# Patient Record
Sex: Male | Born: 2000 | Race: White | Hispanic: No | Marital: Single | Smoking: Current every day smoker
Health system: Southern US, Community
[De-identification: ages and names within clinical notes are randomized; demographics above are authoritative.]

## PROBLEM LIST (undated history)

## (undated) DIAGNOSIS — F329 Major depressive disorder, single episode, unspecified: Secondary | ICD-10-CM

## (undated) DIAGNOSIS — F32A Depression, unspecified: Secondary | ICD-10-CM

## (undated) DIAGNOSIS — F419 Anxiety disorder, unspecified: Secondary | ICD-10-CM

## (undated) DIAGNOSIS — F909 Attention-deficit hyperactivity disorder, unspecified type: Secondary | ICD-10-CM

## (undated) DIAGNOSIS — T7840XA Allergy, unspecified, initial encounter: Secondary | ICD-10-CM

## (undated) DIAGNOSIS — K5900 Constipation, unspecified: Secondary | ICD-10-CM

## (undated) HISTORY — DX: Anxiety disorder, unspecified: F41.9

## (undated) HISTORY — DX: Depression, unspecified: F32.A

## (undated) HISTORY — DX: Major depressive disorder, single episode, unspecified: F32.9

## (undated) HISTORY — DX: Allergy, unspecified, initial encounter: T78.40XA

## (undated) HISTORY — DX: Attention-deficit hyperactivity disorder, unspecified type: F90.9

---
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
## 2019-07-08 ENCOUNTER — Ambulatory Visit (INDEPENDENT_AMBULATORY_CARE_PROVIDER_SITE_OTHER): Admitting: Family Medicine

## 2019-07-08 ENCOUNTER — Other Ambulatory Visit: Payer: Self-pay

## 2019-07-08 ENCOUNTER — Encounter: Payer: Self-pay | Admitting: Family Medicine

## 2019-07-08 VITALS — BP 134/85 | HR 67 | Temp 98.4°F | Resp 16 | Ht 69.5 in | Wt 121.0 lb

## 2019-07-08 DIAGNOSIS — N50812 Left testicular pain: Secondary | ICD-10-CM | POA: Diagnosis not present

## 2019-07-08 NOTE — Progress Notes (Signed)
OFFICE VISIT  07/08/2019   CC:  Chief Complaint  Patient presents with  . Testicle pain   HPI:    Patient is a 18 y.o. Caucasian male who presents for testicle pain. Painful left testicle 07/01/19, constant pain/squeezing sensation for about 24h, spontaneous resolution the next day.  He does feel like the L testicle is a little bigger than R and ? Whether he feels something in L scrotum.   No preceding trauma or intercourse. No recurrence of pain since 07/01/19. Denies hx of STD.  No penile d/c and no GU skin lesion or dysuria.   Past Medical History:  Diagnosis Date  . ADHD   . Allergy   . Anxiety   . Depression     History reviewed. No pertinent surgical history.  Outpatient Medications Prior to Visit  Medication Sig Dispense Refill  . omeprazole (PRILOSEC) 40 MG capsule Take 1 capsule (40 mg total) by mouth daily. (Patient not taking: Reported on 07/08/2019) 90 capsule 0  . ondansetron (ZOFRAN) 4 MG tablet Take 1 tablet (4 mg total) by mouth every 8 (eight) hours as needed for nausea or vomiting. (Patient not taking: Reported on 07/08/2019) 20 tablet 0   No facility-administered medications prior to visit.     No Known Allergies  ROS As per HPI  PE: Blood pressure 134/85, pulse 67, temperature 98.4 F (36.9 C), temperature source Temporal, resp. rate 16, height 5' 9.5" (1.765 m), weight 121 lb (54.9 kg), SpO2 100 %. Gen: Alert, well appearing.  Patient is oriented to person, place, time, and situation. AFFECT: pleasant, lucid thought and speech. Genitals normal; both testes normal without tenderness, masses, hydroceles, varicoceles, erythema or swelling. Shaft normal, circumcised, meatus normal without discharge. No inguinal hernia noted. No inguinal lymphadenopathy.   LABS:  none  IMPRESSION AND PLAN:  Left testicle pain, resolved. Exam normal. He points to L epididymal area and when I put my finger on epididymus today he said this is where to pain was felt  the most when he had it.   Low suspicion of any worrisome GU pathology at this time. Reassured pt. Signs/symptoms to call or return for were reviewed and pt expressed understanding.  An After Visit Summary was printed and given to the patient.  FOLLOW UP: No follow-ups on file.  Signed:  Crissie Sickles, MD           07/08/2019

## 2019-08-15 ENCOUNTER — Emergency Department (HOSPITAL_COMMUNITY)
Admission: EM | Admit: 2019-08-15 | Discharge: 2019-08-15 | Disposition: A | Discharge status: Home / Self Care | Attending: Emergency Medicine | Admitting: Emergency Medicine

## 2019-08-15 ENCOUNTER — Encounter (HOSPITAL_COMMUNITY): Payer: Self-pay | Admitting: *Deleted

## 2019-08-15 ENCOUNTER — Emergency Department (HOSPITAL_COMMUNITY)

## 2019-08-15 ENCOUNTER — Other Ambulatory Visit: Payer: Self-pay

## 2019-08-15 DIAGNOSIS — N200 Calculus of kidney: Secondary | ICD-10-CM | POA: Diagnosis not present

## 2019-08-15 DIAGNOSIS — R1032 Left lower quadrant pain: Secondary | ICD-10-CM | POA: Diagnosis present

## 2019-08-15 DIAGNOSIS — N390 Urinary tract infection, site not specified: Secondary | ICD-10-CM | POA: Diagnosis not present

## 2019-08-15 DIAGNOSIS — F17210 Nicotine dependence, cigarettes, uncomplicated: Secondary | ICD-10-CM | POA: Diagnosis not present

## 2019-08-15 DIAGNOSIS — R319 Hematuria, unspecified: Secondary | ICD-10-CM

## 2019-08-15 HISTORY — DX: Constipation, unspecified: K59.00

## 2019-08-15 LAB — URINALYSIS, ROUTINE W REFLEX MICROSCOPIC
Bilirubin Urine: NEGATIVE
Glucose, UA: NEGATIVE mg/dL
Ketones, ur: 20 mg/dL — AB
Leukocytes,Ua: NEGATIVE
Nitrite: POSITIVE — AB
Protein, ur: 100 mg/dL — AB
Specific Gravity, Urine: 1.024 (ref 1.005–1.030)
pH: 5 (ref 5.0–8.0)

## 2019-08-15 LAB — CBC
HCT: 51 % (ref 39.0–52.0)
Hemoglobin: 16.9 g/dL (ref 13.0–17.0)
MCH: 30.2 pg (ref 26.0–34.0)
MCHC: 33.1 g/dL (ref 30.0–36.0)
MCV: 91.1 fL (ref 80.0–100.0)
Platelets: 181 10*3/uL (ref 150–400)
RBC: 5.6 MIL/uL (ref 4.22–5.81)
RDW: 12.8 % (ref 11.5–15.5)
WBC: 6.7 10*3/uL (ref 4.0–10.5)
nRBC: 0 % (ref 0.0–0.2)

## 2019-08-15 LAB — COMPREHENSIVE METABOLIC PANEL
ALT: 72 U/L — ABNORMAL HIGH (ref 0–44)
AST: 38 U/L (ref 15–41)
Albumin: 5.1 g/dL — ABNORMAL HIGH (ref 3.5–5.0)
Alkaline Phosphatase: 94 U/L (ref 38–126)
Anion gap: 11 (ref 5–15)
BUN: 14 mg/dL (ref 6–20)
CO2: 27 mmol/L (ref 22–32)
Calcium: 10.3 mg/dL (ref 8.9–10.3)
Chloride: 101 mmol/L (ref 98–111)
Creatinine, Ser: 0.87 mg/dL (ref 0.61–1.24)
GFR calc Af Amer: >60 mL/min (ref >60)
GFR calc non Af Amer: >60 mL/min (ref >60)
Glucose, Bld: 93 mg/dL (ref 70–99)
Potassium: 4.6 mmol/L (ref 3.5–5.1)
Sodium: 139 mmol/L (ref 135–145)
Total Bilirubin: 1.6 mg/dL — ABNORMAL HIGH (ref 0.3–1.2)
Total Protein: 8.4 g/dL — ABNORMAL HIGH (ref 6.5–8.1)

## 2019-08-15 LAB — LIPASE, BLOOD: Lipase: 14 U/L (ref 11–51)

## 2019-08-15 MED ORDER — SODIUM CHLORIDE 0.9% FLUSH: 3.0000 mL | Freq: Once | INTRAVENOUS | Status: DC

## 2019-08-15 MED ORDER — TAMSULOSIN HCL 0.4 MG PO CAPS: 0.4000 mg | ORAL_CAPSULE | Freq: Every day | ORAL | 0 refills | Status: AC

## 2019-08-15 MED ORDER — ONDANSETRON 4 MG PO TBDP: 4.0000 mg | ORAL_TABLET | Freq: Three times a day (TID) | ORAL | 0 refills | Status: AC | PRN

## 2019-08-15 MED ORDER — IOHEXOL 300 MG/ML  SOLN
80.0000 mL | Freq: Once | INTRAMUSCULAR | Status: AC | PRN
  Administered 2019-08-15: 80 mL via INTRAVENOUS

## 2019-08-15 MED ORDER — HYDROCODONE-ACETAMINOPHEN 5-325 MG PO TABS: 1.0000 | ORAL_TABLET | ORAL | 0 refills | Status: AC | PRN

## 2019-08-15 MED ORDER — CEPHALEXIN 500 MG PO CAPS: 500.0000 mg | ORAL_CAPSULE | Freq: Four times a day (QID) | ORAL | 0 refills | Status: AC

## 2019-08-15 NOTE — ED Notes (Signed)
Urine spec to lab 

## 2019-08-15 NOTE — ED Notes (Signed)
KS, PA assessing

## 2019-08-15 NOTE — ED Notes (Signed)
From CT 

## 2019-08-15 NOTE — Discharge Instructions (Signed)
Schedule to see the Urologist for evaluation  

## 2019-08-15 NOTE — ED Notes (Signed)
Call to CT   Pt ready

## 2019-08-15 NOTE — ED Triage Notes (Signed)
Pt with LLQ pain with N/V/D for a month.

## 2019-08-15 NOTE — ED Notes (Signed)
Pt reports LLQ pain for one month  Hx of constipation/anxiety  Has not seen PCP

## 2019-08-15 NOTE — ED Provider Notes (Signed)
[facility] EMERGENCY DEPARTMENT Provider Note   CSN: [id number] Arrival date & time: 08/15/19  1258     History Chief Complaint  Patient presents with  . Abdominal Pain    Keith Peters is a 19 y.o. male.  The history is provided by the patient. No language interpreter was used.  Abdominal Pain Pain location:  LLQ and L flank Pain quality: aching   Pain severity:  Moderate Onset quality:  Gradual Duration:  2 days Timing:  Intermittent Progression:  Worsening Chronicity:  New Relieved by:  Nothing Worsened by:  Nothing Ineffective treatments:  None tried Associated symptoms: nausea        Past Medical History:  Diagnosis Date  . ADHD   . Allergy   . Anxiety   . Constipation   . Depression     Patient Active Problem List   Diagnosis Date Noted  . Anxiety and fearfulness of childhood and adolescence 02/28/2012  . ADHD (attention deficit hyperactivity disorder), combined type 02/28/2012  . Depression 02/28/2012    History reviewed. No pertinent surgical history.     Family History  Problem Relation Age of Onset  . Cancer Other   . Hypertension Other   . Heart disease Other   . Hyperlipidemia Other   . Alcohol abuse Mother   . Depression Mother   . Drug abuse Mother   . Mental illness Mother   . Miscarriages / [place] Mother   . Depression Father   . Hearing loss Father   . Hyperlipidemia Father   . Hypertension Father   . Mental illness Father   . Stroke Father   . Depression Sister   . Learning disabilities Brother   . Depression Maternal Grandmother   . Hypertension Maternal Grandmother   . Mental illness Maternal Grandmother   . Cancer Maternal Grandfather   . Hypertension Maternal Grandfather   . Hypertension Paternal Grandmother   . Hypertension Paternal Grandfather     Social History   Tobacco Use  . Smoking status: Current Every Day Smoker    Types: Cigarettes  . Smokeless tobacco: Never Used  Substance Use Topics    . Alcohol use: No  . Drug use: Not Currently    Home Medications Prior to Admission medications   Medication Sig Start Date End Date Taking? Authorizing Provider  cephALEXin (KEFLEX) 500 MG capsule Take 1 capsule (500 mg total) by mouth 4 (four) times daily for 7 days. 08/15/19 08/22/19  Fransico Meadow, PA-C  HYDROcodone-acetaminophen (NORCO/VICODIN) 5-325 MG tablet Take 1 tablet by mouth every 4 (four) hours as needed. 08/15/19   Fransico Meadow, PA-C  ondansetron (ZOFRAN ODT) 4 MG disintegrating tablet Take 1 tablet (4 mg total) by mouth every 8 (eight) hours as needed for nausea or vomiting. 08/15/19   Fransico Meadow, PA-C  tamsulosin (FLOMAX) 0.4 MG CAPS capsule Take 1 capsule (0.4 mg total) by mouth daily. 08/15/19   Fransico Meadow, PA-C  omeprazole (PRILOSEC) 40 MG capsule Take 1 capsule (40 mg total) by mouth daily. Patient not taking: Reported on 07/08/2019 11/18/17 08/15/19  Howard Pouch A, DO    Allergies    Patient has no known allergies.  Review of Systems   Review of Systems  Gastrointestinal: Positive for abdominal pain and nausea.  All other systems reviewed and are negative.   Physical Exam Updated Vital Signs BP (!) 114/59 (BP Location: Right Arm)   Pulse (!) 110   Temp 98.3 F (36.8 C) (Oral)  Resp 16   Ht 5\' 10"  (1.778 m)   Wt 54.4 kg   SpO2 99%   BMI 17.22 kg/m   Physical Exam Vitals and nursing note reviewed.  Constitutional:      Appearance: He is well-developed.  HENT:     Head: Normocephalic and atraumatic.  Eyes:     Conjunctiva/sclera: Conjunctivae normal.  Cardiovascular:     Rate and Rhythm: Normal rate and regular rhythm.     Heart sounds: No murmur.  Pulmonary:     Effort: Pulmonary effort is normal. No respiratory distress.     Breath sounds: Normal breath sounds.  Abdominal:     General: Abdomen is flat. Bowel sounds are normal.     Palpations: Abdomen is soft.     Tenderness: There is abdominal tenderness in the left lower quadrant.   Musculoskeletal:     Cervical back: Neck supple.  Skin:    General: Skin is warm and dry.  Neurological:     General: No focal deficit present.     Mental Status: He is alert.  Psychiatric:        Mood and Affect: Mood normal.     ED Results / Procedures / Treatments   Labs (all labs ordered are listed, but only abnormal results are displayed) Labs Reviewed  COMPREHENSIVE METABOLIC PANEL - Abnormal; Notable for the following components:      Result Value   Total Protein 8.4 (*)    Albumin 5.1 (*)    ALT 72 (*)    Total Bilirubin 1.6 (*)    All other components within normal limits  URINALYSIS, ROUTINE W REFLEX MICROSCOPIC - Abnormal; Notable for the following components:   Color, Urine AMBER (*)    APPearance HAZY (*)    Hgb urine dipstick SMALL (*)    Ketones, ur 20 (*)    Protein, ur 100 (*)    Nitrite POSITIVE (*)    Bacteria, UA MANY (*)    All other components within normal limits  LIPASE, BLOOD  CBC    EKG None  Radiology CT ABDOMEN PELVIS W CONTRAST  Result Date: 08/15/2019 CLINICAL DATA:  Left lower quadrant abdominal pain, nausea and vomiting for 1 month. EXAM: CT ABDOMEN AND PELVIS WITH CONTRAST TECHNIQUE: Multidetector CT imaging of the abdomen and pelvis was performed using the standard protocol following bolus administration of intravenous contrast. CONTRAST:  39mL OMNIPAQUE IOHEXOL 300 MG/ML  SOLN COMPARISON:  None. FINDINGS: Lower chest: No significant pulmonary nodules or acute consolidative airspace disease. Hepatobiliary: Normal liver size. No liver mass. Normal gallbladder with no radiopaque cholelithiasis. No biliary ductal dilatation. Pancreas: Normal, with no mass or duct dilation. Spleen: Normal size. No mass. Adrenals/Urinary Tract: Normal adrenals. There is a 4 mm stone at the left ureterovesical junction. Normal caliber ureters. No hydronephrosis. Asymmetrically delayed left contrast nephrogram. No renal masses. Normal nondistended bladder.  Stomach/Bowel: Normal non-distended stomach. Normal caliber small bowel with no small bowel wall thickening. Normal appendix. Normal large bowel with no diverticulosis, large bowel wall thickening or pericolonic fat stranding. Vascular/Lymphatic: Normal caliber abdominal aorta. Patent portal, splenic, hepatic and renal veins. No pathologically enlarged lymph nodes in the abdomen or pelvis. Reproductive: Normal size prostate. Other: No pneumoperitoneum, ascites or focal fluid collection. Musculoskeletal: No aggressive appearing focal osseous lesions. IMPRESSION: Left ureterovesical junction 4 mm stone. Delayed left contrast nephrogram indicating some degree of obstruction, despite absence of hydronephrosis. Electronically Signed   By: 91m M.D.   On: 08/15/2019 17:28  Procedures Procedures (including critical care time)  Medications Ordered in ED Medications  iohexol (OMNIPAQUE) 300 MG/ML solution 80 mL (80 mLs Intravenous Contrast Given 08/15/19 1703)    ED Course  I have reviewed the triage vital signs and the nursing notes.  Pertinent labs & imaging results that were available during my care of the patient were reviewed by me and considered in my medical decision making (see chart for details).    MDM Rules/Calculators/A&P                      MDM  Pt's urine shows wbc's and rbc's.   Ct scan shows 63mm sone at uvj.  Pt counseled on results.  Pt given rx for flomax, hydrocodne, zofran and keflex for possible infection.  Pt advised he should schedule to see urology for follow up.  Final Clinical Impression(s) / ED Diagnoses Final diagnoses:  Kidney stone on left side  Urinary tract infection with hematuria, site unspecified    Rx / DC Orders ED Discharge Orders         Ordered    cephALEXin (KEFLEX) 500 MG capsule  4 times daily     08/15/19 1818    HYDROcodone-acetaminophen (NORCO/VICODIN) 5-325 MG tablet  Every 4 hours PRN     08/15/19 1818    ondansetron (ZOFRAN ODT) 4 MG  disintegrating tablet  Every 8 hours PRN     08/15/19 1818    tamsulosin (FLOMAX) 0.4 MG CAPS capsule  Daily     08/15/19 1818        An After Visit Summary was printed and given to the patient.    Osie Cheeks 08/15/19 2157    Bethann Berkshire, MD 08/16/19 1600

## 2019-08-17 ENCOUNTER — Encounter: Payer: Self-pay | Admitting: Family Medicine

## 2019-08-17 DIAGNOSIS — N200 Calculus of kidney: Secondary | ICD-10-CM | POA: Insufficient documentation

## 2019-08-18 ENCOUNTER — Encounter: Payer: Self-pay | Admitting: Family Medicine

## 2019-08-18 ENCOUNTER — Other Ambulatory Visit: Payer: Self-pay

## 2019-08-18 ENCOUNTER — Ambulatory Visit (INDEPENDENT_AMBULATORY_CARE_PROVIDER_SITE_OTHER): Admitting: Family Medicine

## 2019-08-18 VITALS — Ht 69.5 in

## 2019-08-18 DIAGNOSIS — N200 Calculus of kidney: Secondary | ICD-10-CM

## 2019-08-18 NOTE — Patient Instructions (Signed)

## 2019-08-18 NOTE — Progress Notes (Signed)
VIRTUAL VISIT VIA VIDEO  I connected with [name] on 08/18/19 at  2:00 PM EST by a video enabled telemedicine application and verified that I am speaking with the correct person using two identifiers. Location patient: Home Location provider: [facility], Office Persons participating in the virtual visit: Patient, Dr. Claiborne Billings and R.Baker, LPN  I discussed the limitations of evaluation and management by telemedicine and the availability of in person appointments. The patient expressed understanding and agreed to proceed.   SUBJECTIVE Chief Complaint  Patient presents with  . Abdominal Pain    Pt went to ED and was dx with kidney stone, 8mm. Medication is helping. Pt would like referral to Urology. Pt does not care which MD he sees at urology.     HPI: Joziah Dollins is a 19 y.o. male present for follow-up second to emergency room visit after experiencing acute abdominal pain.  CT was completed and found to have a 4 mm stone, kidney function was normal, CBC normal, urine with many bacteria, small hemoglobin, ketones, nitrites, protein present.  CT abdomen pelvis: Left UV junction 4 mm stone with possible some degree of obstruction, without hydronephrosis per CT. Patient reports since his emergency room visit his pain is better controlled.  He did have one episode of a sharp pain last night and took a pain pill.  His urinary output is unchanged.  He denies any fever, chills or continued nausea.  He is tolerating Keflex antibiotics, Flomax and pain medication prescribed by emergency room.  He is in need of a urology referral today.  ROS: See pertinent positives and negatives per HPI.  Patient Active Problem List   Diagnosis Date Noted  . Nephrolithiasis 08/17/2019  . Anxiety and fearfulness of childhood and adolescence 02/28/2012  . ADHD (attention deficit hyperactivity disorder), combined type 02/28/2012  . Depression 02/28/2012    Social History   Tobacco Use    . Smoking status: Current Every Day Smoker    Types: Cigarettes  . Smokeless tobacco: Never Used  Substance Use Topics  . Alcohol use: No    Current Outpatient Medications:  .  cephALEXin (KEFLEX) 500 MG capsule, Take 1 capsule (500 mg total) by mouth 4 (four) times daily for 7 days., Disp: 28 capsule, Rfl: 0 .  HYDROcodone-acetaminophen (NORCO/VICODIN) 5-325 MG tablet, Take 1 tablet by mouth every 4 (four) hours as needed., Disp: 10 tablet, Rfl: 0 .  ondansetron (ZOFRAN ODT) 4 MG disintegrating tablet, Take 1 tablet (4 mg total) by mouth every 8 (eight) hours as needed for nausea or vomiting., Disp: 20 tablet, Rfl: 0 .  tamsulosin (FLOMAX) 0.4 MG CAPS capsule, Take 1 capsule (0.4 mg total) by mouth daily., Disp: 10 capsule, Rfl: 0  No Known Allergies  OBJECTIVE: Ht 5' 9.5" (1.765 m)   BMI 17.47 kg/m   Gen: No acute distress. Nontoxic in appearance.  Appears well and comfortable. HENT: AT. [place].  MMM.  Eyes:Pupils Equal Round Reactive to light, Extraocular movements intact,  Conjunctiva without redness, discharge or icterus. Neuro: Normal gait. Alert. Oriented x3  Psych: Normal affect, dress and demeanor. Normal speech. Normal thought content and judgment.  ASSESSMENT AND PLAN: Janziel Hockett is a 19 y.o. male present for  Nephrolithiasis Patient with first episode of nephrolithiasis.  His vitals are stable and his pain is controlled at this time.  He is taking the Flomax, pain medication and antibiotic as prescribed. -Referral to urology placed today. - Ambulatory referral to Urology -Emergent  precautions discussed with patient.  If fever develops, worsening pain or decreased urinary output he is to be seen in the emergency room immediately.   Referral Orders     Ambulatory referral to Urology   [facility], DO 08/18/2019

## 2021-08-23 IMAGING — CT CT ABD-PELV W/ CM
2 of 4 series · 15 of 46 positions shown, 17 images · IV contrast (Omnipaque or Isovue)
Comparison: None.

CLINICAL DATA: Left lower quadrant abdominal pain, nausea and
vomiting for 1 month.

EXAM:
CT ABDOMEN AND PELVIS WITH CONTRAST
TECHNIQUE: Multidetector CT imaging of the abdomen and pelvis was performed
using the standard protocol following bolus administration of
intravenous contrast.
CONTRAST:  80mL OMNIPAQUE IOHEXOL 300 MG/ML  SOLN

[Series 2: axial st · axial · 0.73mm/px · z∈[+882,+1297]mm · 12 of 95 slices shown, 14 images]
[im 6/95  soft-tissue]
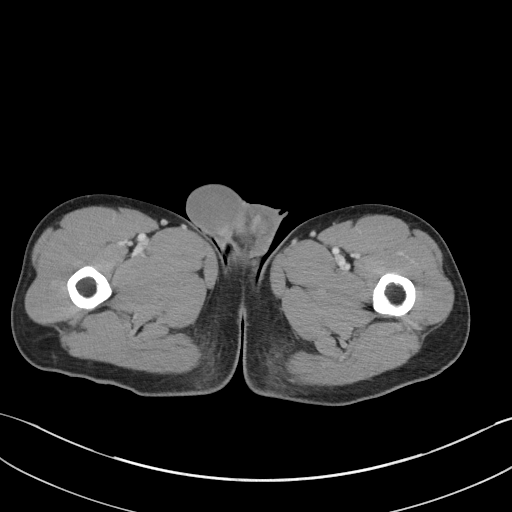
[im 6/95  bone]
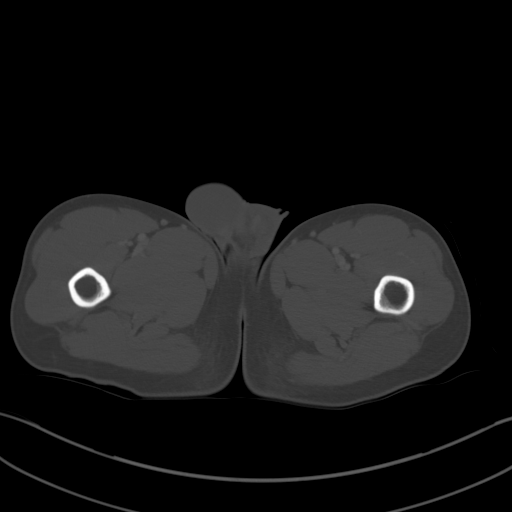
[im 17/95  soft-tissue]
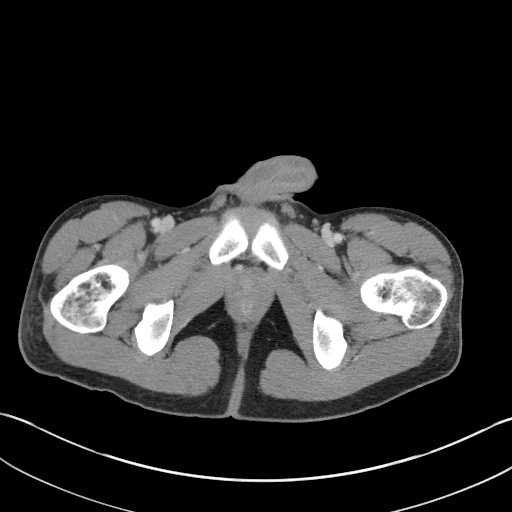
[im 23/95  soft-tissue]
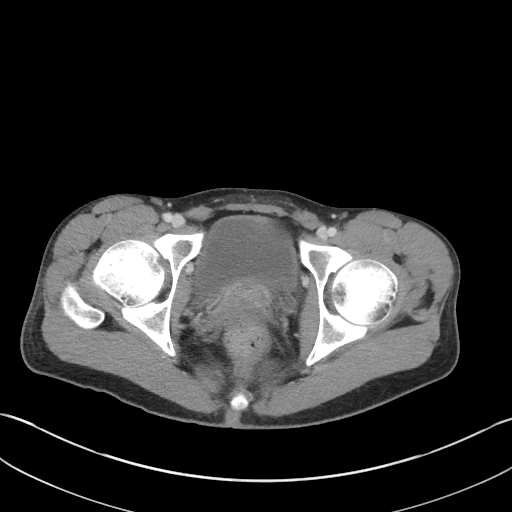
[im 28/95  soft-tissue]
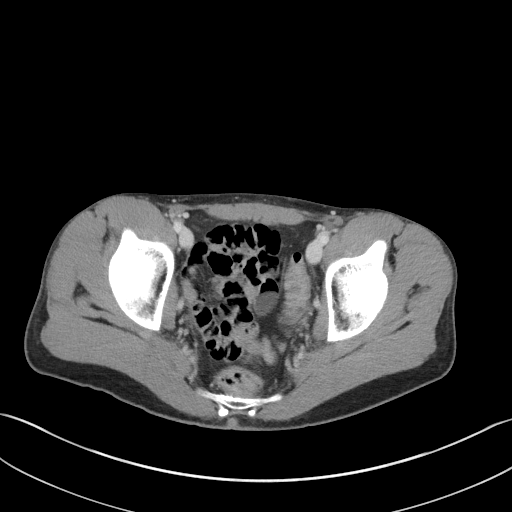
[im 39/95  soft-tissue]
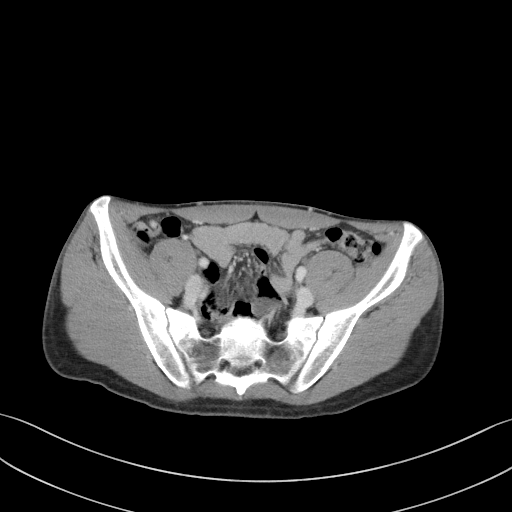
[im 45/95  soft-tissue]
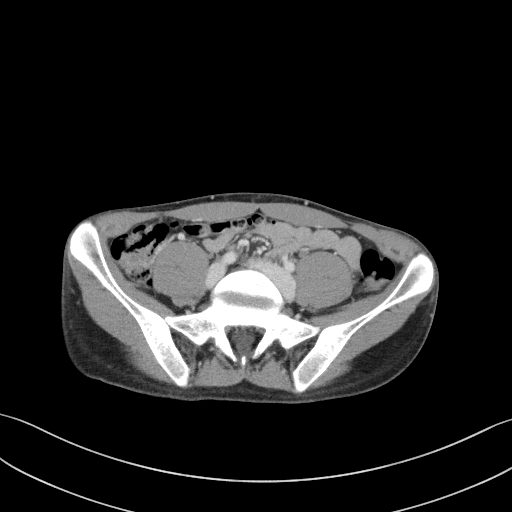
[im 50/95  soft-tissue]
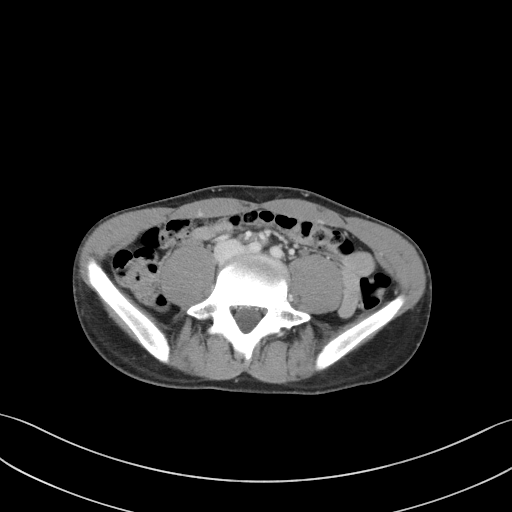
[im 61/95  soft-tissue]
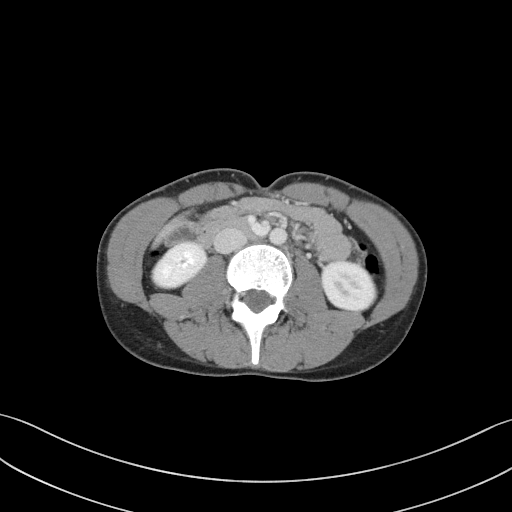
[im 67/95  soft-tissue]
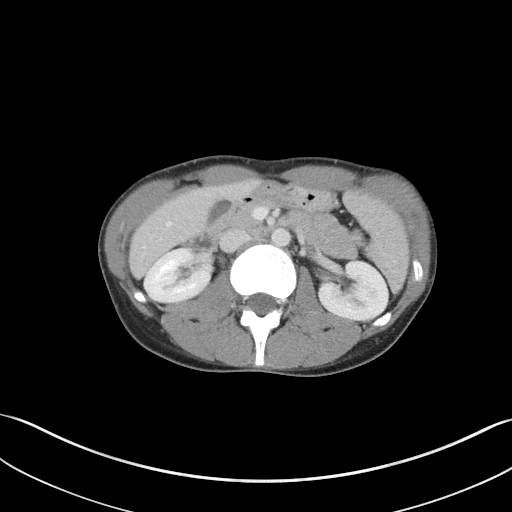
[im 67/95  bone]
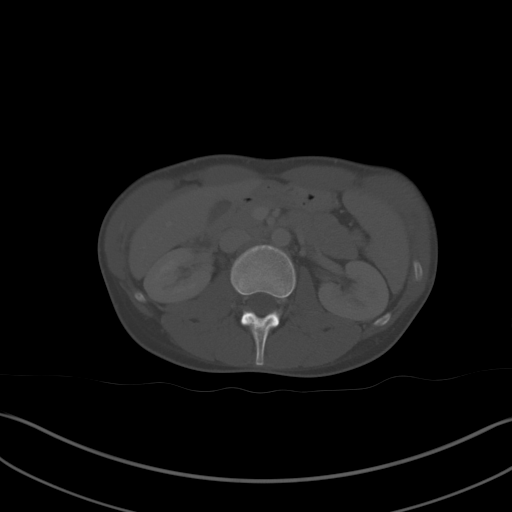
[im 72/95  soft-tissue]
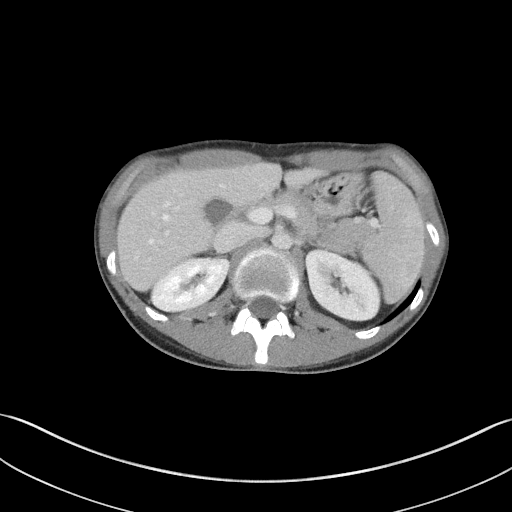
[im 83/95  soft-tissue]
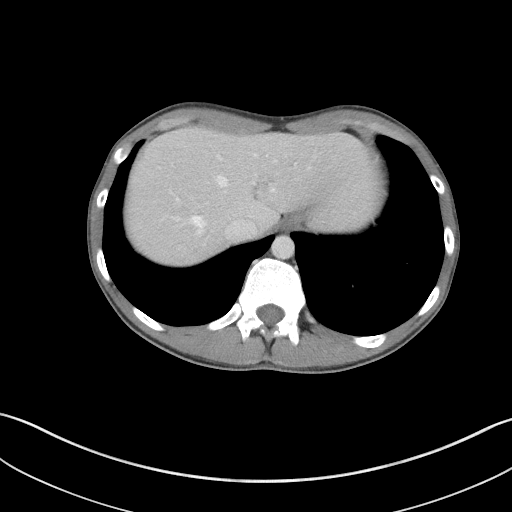
[im 89/95  soft-tissue]
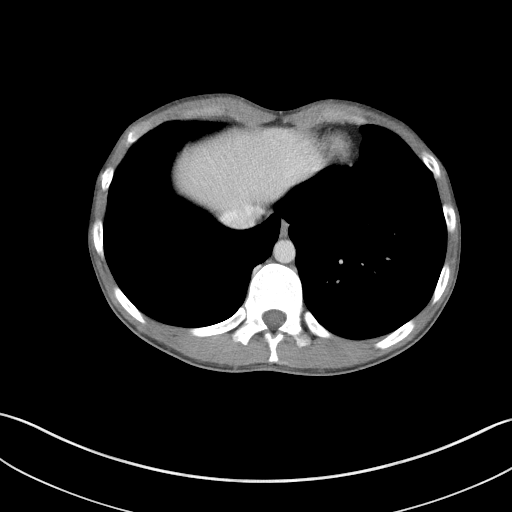

[Series 5: coronal st · coronal · 0.64mm/px · 3 of 87 slices shown]
[im 29/87  soft-tissue]
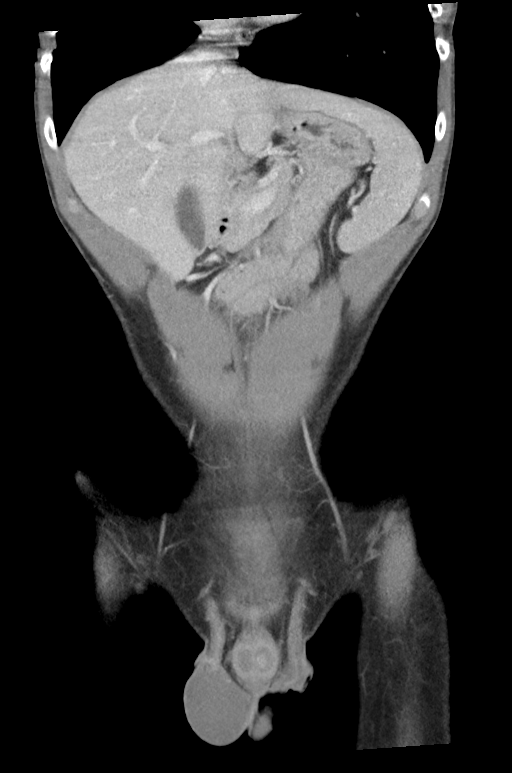
[im 39/87  soft-tissue]
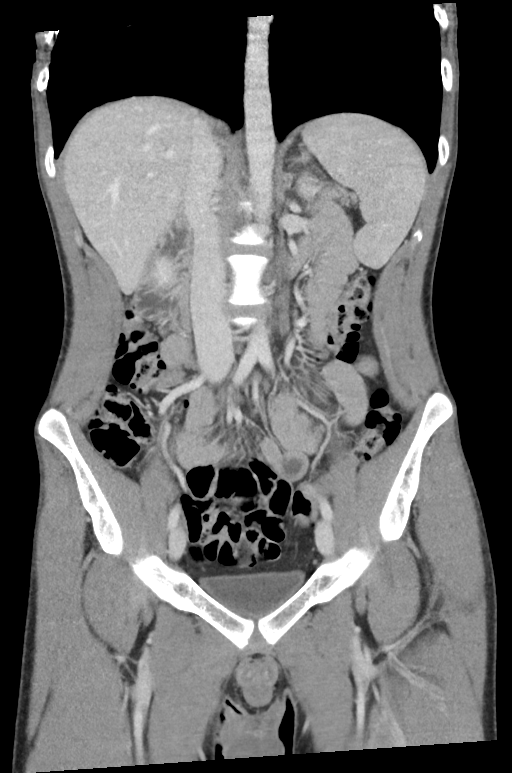
[im 48/87  soft-tissue]
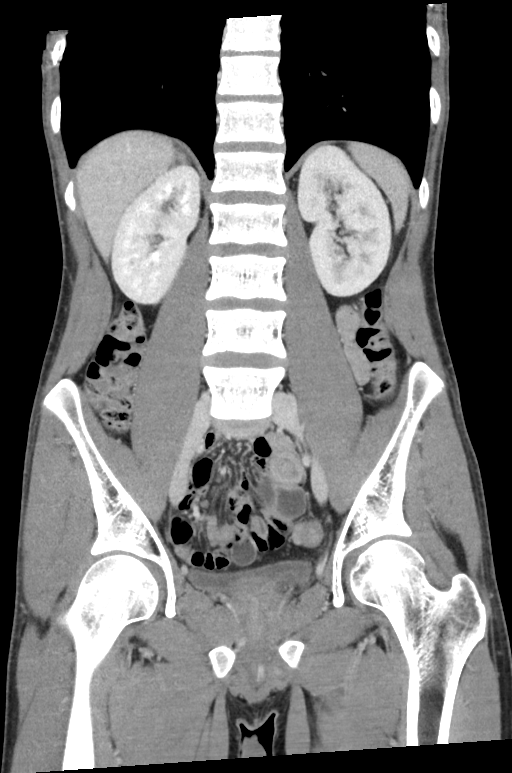

[15 of 46 positions shown; findings below may reference images not displayed]

FINDINGS: Lower chest: No significant pulmonary nodules or acute consolidative
airspace disease.

Hepatobiliary: Normal liver size. No liver mass. Normal gallbladder
with no radiopaque cholelithiasis. No biliary ductal dilatation.

Pancreas: Normal, with no mass or duct dilation.

Spleen: Normal size. No mass.

Adrenals/Urinary Tract: Normal adrenals. There is a 4 mm stone at
the left ureterovesical junction. Normal caliber ureters. No
hydronephrosis. Asymmetrically delayed left contrast nephrogram. No
renal masses. Normal nondistended bladder.

Stomach/Bowel: Normal non-distended stomach. Normal caliber small
bowel with no small bowel wall thickening. Normal appendix. Normal
large bowel with no diverticulosis, large bowel wall thickening or
pericolonic fat stranding.

Vascular/Lymphatic: Normal caliber abdominal aorta. Patent portal,
splenic, hepatic and renal veins. No pathologically enlarged lymph
nodes in the abdomen or pelvis.

Reproductive: Normal size prostate.

Other: No pneumoperitoneum, ascites or focal fluid collection.

Musculoskeletal: No aggressive appearing focal osseous lesions.
IMPRESSION: Left ureterovesical junction 4 mm stone. Delayed left contrast
nephrogram indicating some degree of obstruction, despite absence of
hydronephrosis.
